# Patient Record
Sex: Female | Born: 1980 | Race: White | Hispanic: No | Marital: Single | Smoking: Never smoker
Health system: Southern US, Community
[De-identification: ages and names within clinical notes are randomized; demographics above are authoritative.]

## PROBLEM LIST (undated history)

## (undated) DIAGNOSIS — Z789 Other specified health status: Secondary | ICD-10-CM

## (undated) HISTORY — PX: NO PAST SURGERIES: SHX2092

## (undated) HISTORY — PX: OTHER SURGICAL HISTORY: SHX169

---

## 2005-06-30 ENCOUNTER — Emergency Department (HOSPITAL_COMMUNITY): Admission: EM | Admit: 2005-06-30 | Discharge: 2005-06-30 | Payer: Self-pay | Admitting: Emergency Medicine

## 2005-07-17 ENCOUNTER — Other Ambulatory Visit: Admission: RE | Admit: 2005-07-17 | Discharge: 2005-07-17 | Payer: Self-pay | Admitting: Obstetrics and Gynecology

## 2006-07-21 ENCOUNTER — Other Ambulatory Visit: Admission: RE | Admit: 2006-07-21 | Discharge: 2006-07-21 | Payer: Self-pay | Admitting: Obstetrics and Gynecology

## 2007-06-19 ENCOUNTER — Emergency Department (HOSPITAL_COMMUNITY): Admission: EM | Admit: 2007-06-19 | Discharge: 2007-06-19 | Payer: Self-pay | Admitting: Emergency Medicine

## 2007-07-22 ENCOUNTER — Other Ambulatory Visit: Admission: RE | Admit: 2007-07-22 | Discharge: 2007-07-22 | Payer: Self-pay | Admitting: Obstetrics and Gynecology

## 2008-07-25 ENCOUNTER — Other Ambulatory Visit: Admission: RE | Admit: 2008-07-25 | Discharge: 2008-07-25 | Payer: Self-pay | Admitting: Obstetrics and Gynecology

## 2009-02-20 ENCOUNTER — Emergency Department (HOSPITAL_COMMUNITY): Admission: EM | Admit: 2009-02-20 | Discharge: 2009-02-20 | Payer: Self-pay | Admitting: Family Medicine

## 2009-03-16 ENCOUNTER — Ambulatory Visit: Payer: Self-pay | Admitting: Family Medicine

## 2009-03-16 LAB — CONVERTED CEMR LAB
Nitrite: NEGATIVE
Protein, U semiquant: >=300
Specific Gravity, Urine: 1.015

## 2009-03-17 ENCOUNTER — Encounter: Payer: Self-pay | Admitting: Family Medicine

## 2009-08-02 LAB — CONVERTED CEMR LAB: Pap Smear: NORMAL

## 2009-08-09 ENCOUNTER — Other Ambulatory Visit: Admission: RE | Admit: 2009-08-09 | Discharge: 2009-08-09 | Payer: Self-pay | Admitting: Obstetrics and Gynecology

## 2009-10-06 ENCOUNTER — Ambulatory Visit: Payer: Self-pay | Admitting: Family Medicine

## 2009-10-06 DIAGNOSIS — R319 Hematuria, unspecified: Secondary | ICD-10-CM | POA: Insufficient documentation

## 2009-10-06 LAB — CONVERTED CEMR LAB
Hemoglobin: 13.5 g/dL
Ketones, urine, test strip: NEGATIVE
Nitrite: NEGATIVE
Protein, U semiquant: NEGATIVE

## 2009-10-10 ENCOUNTER — Telehealth (INDEPENDENT_AMBULATORY_CARE_PROVIDER_SITE_OTHER): Payer: Self-pay | Admitting: *Deleted

## 2009-10-12 ENCOUNTER — Ambulatory Visit: Payer: Self-pay | Admitting: Family Medicine

## 2009-10-13 LAB — CONVERTED CEMR LAB
ALT: 10 units/L (ref 0–35)
AST: 19 units/L (ref 0–37)
BUN: 13 mg/dL (ref 6–23)
Basophils Relative: 0.3 % (ref 0.0–3.0)
Bilirubin, Direct: 0.2 mg/dL (ref 0.0–0.3)
CO2: 27 meq/L (ref 19–32)
Chloride: 105 meq/L (ref 96–112)
Eosinophils Relative: 0.5 % (ref 0.0–5.0)
HCT: 42.7 % (ref 36.0–46.0)
Hemoglobin: 14.4 g/dL (ref 12.0–15.0)
Lymphs Abs: 3.7 10*3/uL (ref 0.7–4.0)
MCV: 89.5 fL (ref 78.0–100.0)
Monocytes Relative: 6.6 % (ref 3.0–12.0)
Neutro Abs: 6.9 10*3/uL (ref 1.4–7.7)
Potassium: 3.9 meq/L (ref 3.5–5.1)
TSH: 2.32 microintl units/mL (ref 0.35–5.50)
Total Bilirubin: 0.6 mg/dL (ref 0.3–1.2)
Total CHOL/HDL Ratio: 4
Total Protein: 7.4 g/dL (ref 6.0–8.3)
WBC: 11.4 10*3/uL — ABNORMAL HIGH (ref 4.5–10.5)

## 2009-11-09 ENCOUNTER — Ambulatory Visit: Payer: Self-pay | Admitting: Family Medicine

## 2009-11-09 LAB — CONVERTED CEMR LAB
Bilirubin Urine: NEGATIVE
Ketones, urine, test strip: NEGATIVE
Protein, U semiquant: NEGATIVE
Urobilinogen, UA: 0.2
pH: 6

## 2009-11-23 ENCOUNTER — Encounter: Payer: Self-pay | Admitting: Family Medicine

## 2009-11-24 ENCOUNTER — Ambulatory Visit (HOSPITAL_COMMUNITY): Admission: RE | Admit: 2009-11-24 | Discharge: 2009-11-24 | Payer: Self-pay | Admitting: Urology

## 2009-12-29 ENCOUNTER — Encounter (INDEPENDENT_AMBULATORY_CARE_PROVIDER_SITE_OTHER): Payer: Self-pay | Admitting: *Deleted

## 2009-12-29 ENCOUNTER — Encounter: Payer: Self-pay | Admitting: Family Medicine

## 2009-12-29 LAB — CONVERTED CEMR LAB
Albumin: 4.4 g/dL
BUN: 16 mg/dL
Calcium: 8.8 mg/dL
HCT: 39.3 %
MCH: 29.2 pg
MCV: 88.3 fL
Sodium, serum: 141 mmol/L
TSH: 3.2 microintl units/mL
Total Bilirubin: 0.3 mg/dL
Triglycerides: 91 mg/dL

## 2010-03-27 ENCOUNTER — Encounter (INDEPENDENT_AMBULATORY_CARE_PROVIDER_SITE_OTHER): Payer: Self-pay | Admitting: *Deleted

## 2010-04-03 NOTE — Assessment & Plan Note (Signed)
Summary: POSSIBLE UTI/KH   Vital Signs:  Patient Profile:   30 Years Old Female CC:      blood in urine, painful urination Height:     62 inches Weight:      116 pounds O2 Sat:      98 % Temp:     98.4 degrees F oral Pulse rate:   88 / minute Resp:     12 per minute BP sitting:   134 / 84  (right arm)  Pt. in pain?   yes    Intensity:   10    Type:       burning  Vitals Entered By: Lita Mains                   Current Allergies: No known allergies History of Present Illness History from: patient Chief Complaint: blood in urine, painful urination History of Present Illness: painful urination since Monday with gradual increasing severity to this AM. Intense painthis AM with blood in urine. Previous UTI in 01/2009 NO FEVER, NO BACK OR FLANK PAIN, NO N/V. NO VAG DISCHARGE. HAS FREQUENCY, DYSURIA AND URGENCY  REVIEW OF SYSTEMS Constitutional Symptoms      Denies fever, chills, night sweats, weight loss, weight gain, and fatigue.  Eyes       Denies change in vision, eye pain, eye discharge, glasses, contact lenses, and eye surgery. Ear/Nose/Throat/Mouth       Denies hearing loss/aids, change in hearing, ear pain, ear discharge, dizziness, frequent runny nose, frequent nose bleeds, sinus problems, sore throat, hoarseness, and tooth pain or bleeding.  Respiratory       Denies dry cough, productive cough, wheezing, shortness of breath, asthma, bronchitis, and emphysema/COPD.  Cardiovascular       Denies murmurs, chest pain, and tires easily with exhertion.    Gastrointestinal       Denies stomach pain, nausea/vomiting, diarrhea, constipation, blood in bowel movements, and indigestion. Genitourniary       Complains of painful urination.      Denies blood or discharge from vagina, kidney stones, and loss of urinary control.      Comments: blood in urine Neurological       Denies paralysis, seizures, and fainting/blackouts. Musculoskeletal       Denies muscle pain, joint  pain, joint stiffness, decreased range of motion, redness, swelling, muscle weakness, and gout.      Comments: lower pelvic pain Skin       Denies bruising, unusual mles/lumps or sores, and hair/skin or nail changes.  Psych       Denies mood changes, temper/anger issues, anxiety/stress, speech problems, depression, and sleep problems.  Past History:  Family History: Last updated: 03/16/2009 Family History High cholesterol Family History Hypertension-father Family History Thyroid disease-mother  Social History: Last updated: 03/16/2009 Single Never Smoked Alcohol use-no Drug use-no  Past Medical History: Unremarkable  Past Surgical History: Vaginal cysts drained- 06/2007 and 01/2009  Family History: Family History High cholesterol Family History Hypertension-father Family History Thyroid disease-mother  Social History: Single Never Smoked Alcohol use-no Drug use-no Smoking Status:  never Drug Use:  no Physical Exam General appearance: well developed, well nourished, no acute distress Ears: normal, no lesions or deformities Nasal: mucosa pink, nonedematous, no septal deviation, turbinates normal Oral/Pharynx: tongue normal, posterior pharynx without erythema or exudate Chest/Lungs: no rales, wheezes, or rhonchi bilateral, breath sounds equal without effort Heart: regular rate and  rhythm, no murmur Abdomen: soft, non-tender without obvious organomegaly Back: no tenderness  over musculature, straight leg raises negative bilaterally, deep tendon reflexes 2+ at achilles and patella Assessment New Problems: URINARY TRACT INFECTION (ICD-599.0)   Plan New Medications/Changes: PYRIDIUM 200 MG TABS (PHENAZOPYRIDINE HCL) 1 by mouth three times a day PRN  ##12 x 0, 03/16/2009, Cyana Shook DO MACROBID 100 MG CAPS (NITROFURANTOIN MONOHYD MACRO) 1 by mouth BID  ##14 x 0, 03/16/2009, Marvis Moeller DO  New Orders: New Patient Level III [99203]    Prescriptions: PYRIDIUM 200 MG TABS (PHENAZOPYRIDINE HCL) 1 by mouth three times a day PRN  ##12 x 0   Entered and Authorized by:   Marvis Moeller DO   Signed by:   Marvis Moeller DO on 03/16/2009   Method used:   Print then Give to Patient   RxID:   [id number] MACROBID 100 MG CAPS (NITROFURANTOIN MONOHYD MACRO) 1 by mouth BID  ##14 x 0   Entered and Authorized by:   Marvis Moeller DO   Signed by:   Marvis Moeller DO on 03/16/2009   Method used:   Print then Give to Patient   RxID:   [id number]   Patient Instructions: 1)  AVOID CAFFEINE PRODUCTS. CRANBERRY JUICE RECOMMENDED. ONCE CULTURE RESULTS RECEIVED , WE WILL CONTACT YOU IF NEW INSTRUCTIONS INDICATED. FOLLOW UP WITH YOUR PCP OR RETURN IF SYMPTOMS PERSIST OR WORSEN.  Laboratory Results   Urine Tests  Date/Time Received:  March 16, 2009 12:13 PM   Date/Time Reported:  March 16, 2009 12:13 PM   Routine Urinalysis   Color: red Appearance: Cloudy Glucose: negative   (Normal Range: Negative) Bilirubin: small   (Normal Range: Negative) Ketone: negative   (Normal Range: Negative) Spec. Gravity: 1.015   (Normal Range: 1.003-1.035) Blood: large   (Normal Range: Negative) pH: 7.0   (Normal Range: 5.0-8.0) Protein: >=300   (Normal Range: Negative) Urobilinogen: 0.2   (Normal Range: 0-1) Nitrite: negative   (Normal Range: Negative) Leukocyte Esterace: large   (Normal Range: Negative)

## 2010-04-03 NOTE — Assessment & Plan Note (Signed)
Summary: NEW TO EST/CPX/KN   Vital Signs:  Patient profile:   30 year old female Height:      62.25 inches Weight:      115 pounds BMI:     20.94 Pulse rate:   79 / minute BP sitting:   110 / 70  (left arm)  Vitals Entered By: Doristine Devoid CMA (October 06, 2009 8:43 AM) CC: CPX-not fasting   Vision Screening:Left eye with correction: 20 / 15 Right eye with correction: 20 / 15 Both eyes with correction: 20 / 15         History of Present Illness: 30 yo woman here today to establish care.  no previous PCP.  GYNRichardson Dopp, exam done 6/11.  not fasting today.  no concerns today.  Preventive Screening-Counseling & Management  Alcohol-Tobacco     Alcohol drinks/day: <1     Smoking Status: never  Caffeine-Diet-Exercise     Does Patient Exercise: yes     Type of exercise: cardio     Times/week: <3      Sexual History:  currently monogamous.        Drug Use:  never.    Current Medications (verified): 1)  Junel 1/20 1-20 Mg-Mcg Tabs (Norethindrone Acet-Ethinyl Est) .... Once Daily  Allergies (verified): No Known Drug Allergies  Past History:  Past Medical History: Last updated: 03/16/2009 Unremarkable  Past Surgical History: Last updated: 03/16/2009 Vaginal cysts drained- 06/2007 and 01/2009  Family History: Last updated: 10/06/2009 Family History High cholesterol Family History Hypertension-father Family History Thyroid disease-mother CAD-paternal and maternal grandfather Breast Cancer- no Colon Cancer- no  Family History: Family History High cholesterol Family History Hypertension-father Family History Thyroid disease-mother CAD-paternal and maternal grandfather Breast Cancer- no Colon Cancer- no  Social History: Single Working at [employer] on [employer], going back to school 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    Physical Exam  General:  well developed, well nourished, no acute distress Head:  Normocephalic and atraumatic without obvious abnormalities. No apparent alopecia or balding. Eyes:  No corneal or conjunctival inflammation noted. EOMI. Perrla. Funduscopic exam benign, without hemorrhages, exudates or papilledema. Vision grossly normal. Ears:  External ear exam shows no significant lesions or deformities.  Otoscopic examination reveals clear canals, tympanic membranes are intact bilaterally without bulging, retraction, inflammation or discharge. Hearing is grossly normal bilaterally. Nose:  External nasal examination shows no deformity or inflammation. Nasal mucosa are pink and moist without lesions or exudates. Mouth:  Oral mucosa and oropharynx without lesions or exudates.  Teeth in good repair. Neck:  No deformities, masses, or tenderness noted. Breasts:  deferred to GYN Lungs:  Normal respiratory effort, chest expands symmetrically. Lungs are clear to auscultation, no crackles or wheezes. Heart:  Normal rate and regular rhythm. S1 and S2 normal without gallop, murmur, click, rub or other extra sounds. Abdomen:  Bowel sounds positive,abdomen soft and non-tender without masses, organomegaly or hernias noted. Genitalia:  deferred to gyn Msk:  No deformity or scoliosis noted of thoracic or lumbar spine.   Pulses:  +2 carotid, radial, DP Extremities:  No clubbing,  cyanosis, edema, or deformity noted with normal full range of motion of all joints.   Neurologic:  No cranial nerve deficits noted. Station and gait are normal. Plantar reflexes  are down-going bilaterally. DTRs are symmetrical throughout. Sensory, motor and coordinative functions appear intact. Skin:  Intact without suspicious lesions or rashes Cervical Nodes:  No lymphadenopathy noted Axillary Nodes:  No palpable lymphadenopathy Psych:  Cognition and judgment appear intact. Alert and cooperative with normal attention span and concentration. No apparent delusions, illusions, hallucinations   Impression & Recommendations:  Problem # 1:  PHYSICAL EXAMINATION (ICD-V70.0) Assessment New PE WNL.  form completed for nursing program.  anticipatory guidance provided. Orders: Vision Screening (11914) Hgb (78295)  Problem # 2:  HEMATURIA UNSPECIFIED (ICD-599.70) Assessment: New  pt w/ asymptomatic hematuria found on UA required for school.  send urine for cx.  repeat UA when pt returns for labs. The following medications were removed from the medication list:    Macrobid 100 Mg Caps (Nitrofurantoin monohyd macro) .Marland Kitchen... 1 by mouth bid  Orders: UA Dipstick w/o Micro (manual) (62130) T-Culture, Urine ([id number])  Complete Medication List: 1)  Junel 1/20 1-20 Mg-mcg Tabs (Norethindrone acet-ethinyl est) .... Once daily  Patient Instructions: 1)  Schedule a fasting lab visit at your convenience 2)  BMP prior to visit, ICD-9: V70 3)  Hepatic Panel prior to visit ICD-9: V70 4)  Lipid panel prior to visit ICD-9 : V70 5)  TSH prior to visit ICD-9 : V70 6)  CBC w/ Diff prior to visit ICD-9 : V70 7)  Your exam looks great!  Keep up the good work! 8)  Call with any questions or concerns 9)  Welcome!  We're glad to have you! 10)  Good luck at school!   Preventive Care Screening  Pap Smear:    Date:  08/02/2009    Results:  normal   Laboratory Results   Urine Tests    Routine Urinalysis   Glucose: negative   (Normal Range: Negative) Bilirubin: negative   (Normal Range: Negative) Ketone: negative   (Normal Range: Negative) Spec. Gravity: 1.015   (Normal  Range: 1.003-1.035) Blood: moderate   (Normal Range: Negative) pH: 7.0   (Normal Range: 5.0-8.0) Protein: negative   (Normal Range: Negative) Urobilinogen: 0.2   (Normal Range: 0-1) Nitrite: negative   (Normal Range: Negative) Leukocyte Esterace: negative   (Normal Range: Negative)     CBC   HGB:  13.5 g/dL   (Normal Range: 95.2-84.1 in Males, 12.0-15.0 in Females)

## 2010-04-03 NOTE — Assessment & Plan Note (Signed)
Summary: 1 month f/u recheck urine / tf,cma   Vital Signs:  Patient profile:   30 year old female Weight:      113 pounds Pulse rate:   78 / minute BP sitting:   112 / 74  (left arm)  Vitals Entered By: Doristine Devoid CMA (November 09, 2009 9:24 AM) CC: recheck urine   History of Present Illness: 30 yo woman here today to f/u on microscopic hematuria.  no gross blood visible, no dysuria, frequency, urgency, hesitancy.  family hx of kidney stones.  pt w/ ? hx of stone- had acute pain, frank blood in urine, went to UC for pain and was dx'd w/ UTI.  pt reports pain and urine cleared prior to starting abx.  Allergies (verified): No Known Drug Allergies  Review of Systems      See HPI  Physical Exam  General:  well developed, well nourished, no acute distress Abdomen:  soft, NT/ND, +BS Psych:  Cognition and judgment appear intact. Alert and cooperative with normal attention span and concentration.   Impression & Recommendations:  Problem # 1:  HEMATURIA UNSPECIFIED (ICD-599.70) Assessment Unchanged pt w/ persistant hematuria after 1 month.  asymptomatic.  will refer to urology. Orders: UA Dipstick w/o Micro (manual) ([id number]) Urology Referral (Urology)  Complete Medication List: 1)  Junel 1/20 1-20 Mg-mcg Tabs (Norethindrone acet-ethinyl est) .... Once daily  Patient Instructions: 1)  Someone will call you with your urology appt 2)  Plan on 1 year for your physical, sooner if you need me 3)  Call with any questions or concerns 4)  Try not to stress!!!  Laboratory Results   Urine Tests    Routine Urinalysis   Glucose: negative   (Normal Range: Negative) Bilirubin: negative   (Normal Range: Negative) Ketone: negative   (Normal Range: Negative) Spec. Gravity: 1.015   (Normal Range: 1.003-1.035) Blood: moderate   (Normal Range: Negative) pH: 6.0   (Normal Range: 5.0-8.0) Protein: negative   (Normal Range: Negative) Urobilinogen: 0.2   (Normal Range: 0-1) Nitrite:  negative   (Normal Range: Negative) Leukocyte Esterace: negative   (Normal Range: Negative)

## 2010-04-03 NOTE — Consult Note (Signed)
Summary: [facility]  [facility]   Imported By: Lanelle Bal 12/06/2009 08:39:18  _____________________________________________________________________  External Attachment:    Type:   Image     Comment:   External Document

## 2010-04-03 NOTE — Progress Notes (Signed)
Summary: urine cx  Phone Note Outgoing Call   Call placed by: Doristine Devoid CMA,  October 10, 2009 4:02 PM Call placed to: Patient Summary of Call: no infxn present.  pt w/ unexplained microscopic hematuria.  should repeat UA in 1 month- if still present, will refer to Urology  Follow-up for Phone Call        left message on machine ..........Marland KitchenDoristine Devoid CMA  October 10, 2009 4:02 PM   spoke w/ patient aware of recommendations........Marland KitchenDoristine Devoid CMA  October 10, 2009 4:41 PM

## 2010-04-05 NOTE — Miscellaneous (Signed)
Summary: labs from health screening  Clinical Lists Changes  Observations: Added new observation of TSH: 3.2 microintl units/mL (12/29/2009 9:05) Added new observation of TRIGLYC TOT: 91 mg/dL (54/11/8117 1:47) Added new observation of LDL: 125 mg/dL (82/95/6213 0:86) Added new observation of HDL: 62 mg/dL (57/84/6962 9:52) Added new observation of CHOLESTEROL: 205 mg/dL (84/13/2440 1:02) Added new observation of BILI TOTAL: 0.30 mg/dL (72/53/6644 0:34) Added new observation of ALK PHOS: 41 units/L (12/29/2009 9:05) Added new observation of SGOT (AST): 14 units/L (12/29/2009 9:05) Added new observation of ALBUMIN: 4.4 g/dL (74/25/9563 8:75) Added new observation of CALCIUM: 8.8 mg/dL (64/33/2951 8:84) Added new observation of GLUCOSE SER: 95 mg/dL (16/60/6301 6:01) Added new observation of CREATININE: 0.62 mg/dL (09/32/3557 3:22) Added new observation of BUN: 16 mg/dL (02/54/2706 2:37) Added new observation of CO2 TOTAL: 23 mmol/L (12/29/2009 9:05) Added new observation of CHLORIDE: 103 mmol/L (12/29/2009 9:05) Added new observation of POTASSIUM: 3.6 mmol/L (12/29/2009 9:05) Added new observation of SODIUM: 141 mmol/L (12/29/2009 9:05) Added new observation of PLATELETS: 402 10*3/mm3 (12/29/2009 9:05) Added new observation of MCH: 29.2 pg (12/29/2009 9:05) Added new observation of MCV: 88.3 fL (12/29/2009 9:05) Added new observation of HCT: 39.3 % (12/29/2009 9:05) Added new observation of HGB: 13 g/dL (62/83/1517 6:16) Added new observation of WBC: 12.1 10*3/mm3 (12/29/2009 9:05)  Appended Document: labs from health screening cholesterol much better than at last check- good work!  Appended Document: labs from health screening mailed

## 2010-04-11 NOTE — Letter (Signed)
Summary: [facility]  [facility]   Imported By: Lanelle Bal 04/03/2010 13:49:27  _____________________________________________________________________  External Attachment:    Type:   Image     Comment:   External Document

## 2010-08-20 ENCOUNTER — Other Ambulatory Visit (HOSPITAL_COMMUNITY)
Admission: RE | Admit: 2010-08-20 | Discharge: 2010-08-20 | Disposition: A | Discharge status: Home / Self Care | Payer: Commercial Managed Care - PPO | Source: Ambulatory Visit | Attending: Obstetrics and Gynecology | Admitting: Obstetrics and Gynecology

## 2010-08-20 ENCOUNTER — Other Ambulatory Visit: Payer: Self-pay | Admitting: Obstetrics and Gynecology

## 2010-08-20 DIAGNOSIS — Z01419 Encounter for gynecological examination (general) (routine) without abnormal findings: Secondary | ICD-10-CM | POA: Insufficient documentation

## 2010-10-08 ENCOUNTER — Encounter: Payer: Self-pay | Admitting: Family Medicine

## 2010-10-17 ENCOUNTER — Encounter: Payer: Self-pay | Admitting: Family Medicine

## 2010-10-17 ENCOUNTER — Ambulatory Visit (INDEPENDENT_AMBULATORY_CARE_PROVIDER_SITE_OTHER): Payer: Commercial Managed Care - PPO | Admitting: Family Medicine

## 2010-10-17 DIAGNOSIS — Z0000 Encounter for general adult medical examination without abnormal findings: Secondary | ICD-10-CM | POA: Insufficient documentation

## 2010-10-17 LAB — POCT URINALYSIS DIPSTICK
Bilirubin, UA: NEGATIVE
Glucose, UA: NEGATIVE
Ketones, UA: NEGATIVE
Leukocytes, UA: NEGATIVE
Nitrite, UA: NEGATIVE
Protein, UA: NEGATIVE
Spec Grav, UA: 1.015
Urobilinogen, UA: NEGATIVE
pH, UA: 5

## 2010-10-17 LAB — POCT HEMOGLOBIN: Hemoglobin: 13.4

## 2010-10-17 NOTE — Progress Notes (Signed)
  Subjective:    Patient ID: Diane Barrett, female    DOB: 02/08/1981, 30 y.o.   MRN: [id number]  HPI CPE- UTD on pap.  No concerns today.   Review of Systems Patient reports no vision/ hearing changes, adenopathy,fever, weight change,  persistant/recurrent hoarseness , swallowing issues, chest pain, palpitations, edema, persistant/recurrent cough, hemoptysis, dyspnea (rest/exertional/paroxysmal nocturnal), gastrointestinal bleeding (melena, rectal bleeding), abdominal pain, significant heartburn, bowel changes, GU symptoms (dysuria, hematuria, incontinence), Gyn symptoms (abnormal  bleeding, pain),  syncope, focal weakness, memory loss, numbness & tingling, skin/hair/nail changes, abnormal bruising or bleeding, anxiety, or depression.     Objective:   Physical Exam  General Appearance:    Alert, cooperative, no distress, appears stated age  Head:    Normocephalic, without obvious abnormality, atraumatic  Eyes:    PERRL, conjunctiva/corneas clear, EOM's intact, fundi    benign, both eyes  Ears:    Normal TM's and external ear canals, both ears  Nose:   Nares normal, septum midline, mucosa normal, no drainage    or sinus tenderness  Throat:   Lips, mucosa, and tongue normal; teeth and gums normal  Neck:   Supple, symmetrical, trachea midline, no adenopathy;    Thyroid: no enlargement/tenderness/nodules  Back:     Symmetric, no curvature, ROM normal, no CVA tenderness  Lungs:     Clear to auscultation bilaterally, respirations unlabored  Chest Wall:    No tenderness or deformity   Heart:    Regular rate and rhythm, S1 and S2 normal, no murmur, rub   or gallop  Breast Exam:    Deferred to GYN  Abdomen:     Soft, non-tender, bowel sounds active all four quadrants,    no masses, no organomegaly  Genitalia:    Deferred to GYN  Rectal:    Extremities:   Extremities normal, atraumatic, no cyanosis or edema  Pulses:   2+ and symmetric all extremities  Skin:   Skin color, texture, turgor  normal, no rashes or lesions  Lymph nodes:   Cervical, supraclavicular, and axillary nodes normal  Neurologic:   CNII-XII intact, normal strength, sensation and reflexes    throughout          Assessment & Plan:

## 2010-10-17 NOTE — Patient Instructions (Signed)
Follow up in 1 year or as needed Fax me a copy of your lab results Call with any questions or concerns Good luck w/ school!

## 2010-10-17 NOTE — Assessment & Plan Note (Signed)
Pt's PE WNL.  Will have labs done at routine employment screening this fall.  Anticipatory guidance provided.  Form completed for school.

## 2011-08-20 ENCOUNTER — Other Ambulatory Visit: Payer: Self-pay | Admitting: Obstetrics and Gynecology

## 2011-08-20 ENCOUNTER — Other Ambulatory Visit (HOSPITAL_COMMUNITY)
Admission: RE | Admit: 2011-08-20 | Discharge: 2011-08-20 | Disposition: A | Discharge status: Home / Self Care | Payer: 59 | Source: Ambulatory Visit | Attending: Obstetrics and Gynecology | Admitting: Obstetrics and Gynecology

## 2011-08-20 DIAGNOSIS — Z01419 Encounter for gynecological examination (general) (routine) without abnormal findings: Secondary | ICD-10-CM | POA: Insufficient documentation

## 2011-10-22 ENCOUNTER — Encounter: Payer: Self-pay | Admitting: Family Medicine

## 2011-10-22 ENCOUNTER — Ambulatory Visit (INDEPENDENT_AMBULATORY_CARE_PROVIDER_SITE_OTHER): Payer: 59 | Admitting: Family Medicine

## 2011-10-22 VITALS — BP 108/70 | HR 100 | Temp 98.4°F | Ht 62.5 in | Wt 120.2 lb

## 2011-10-22 DIAGNOSIS — Z0000 Encounter for general adult medical examination without abnormal findings: Secondary | ICD-10-CM

## 2011-10-22 DIAGNOSIS — Z13220 Encounter for screening for lipoid disorders: Secondary | ICD-10-CM

## 2011-10-22 LAB — LIPID PANEL
Total CHOL/HDL Ratio: 4
Triglycerides: 101 mg/dL (ref 0.0–149.0)
VLDL: 20.2 mg/dL (ref 0.0–40.0)

## 2011-10-22 LAB — HEPATIC FUNCTION PANEL
ALT: 15 U/L (ref 0–35)
Alkaline Phosphatase: 45 U/L (ref 39–117)
Bilirubin, Direct: 0.1 mg/dL (ref 0.0–0.3)
Total Protein: 8.3 g/dL (ref 6.0–8.3)

## 2011-10-22 LAB — CBC WITH DIFFERENTIAL/PLATELET
Basophils Absolute: 0 10*3/uL (ref 0.0–0.1)
Lymphocytes Relative: 31 % (ref 12.0–46.0)
Monocytes Relative: 6 % (ref 3.0–12.0)
Neutrophils Relative %: 62.1 % (ref 43.0–77.0)
Platelets: 392 10*3/uL (ref 150.0–400.0)
RDW: 12.6 % (ref 11.5–14.6)

## 2011-10-22 LAB — BASIC METABOLIC PANEL
CO2: 26 mEq/L (ref 19–32)
Chloride: 102 mEq/L (ref 96–112)
Sodium: 136 mEq/L (ref 135–145)

## 2011-10-22 LAB — LDL CHOLESTEROL, DIRECT: Direct LDL: 176 mg/dL

## 2011-10-22 NOTE — Assessment & Plan Note (Signed)
Pt's PE WNL.  UTD on GYN.  Check labs.  Anticipatory guidance provided.  

## 2011-10-22 NOTE — Telephone Encounter (Signed)
Please review pt response.

## 2011-10-22 NOTE — Progress Notes (Signed)
  Subjective:    Patient ID: Diane Barrett, female    DOB: [birth year], 31 y.o.   MRN: [id number]  HPI CPE- has GYN.  No concerns.   Review of Systems Patient reports no vision/ hearing changes, adenopathy, fever, weight change,  persistant/recurrent hoarseness , swallowing issues, chest pain, palpitations, edema, persistant/recurrent cough, hemoptysis, dyspnea (rest/exertional/paroxysmal nocturnal), gastrointestinal bleeding (melena, rectal bleeding), abdominal pain, significant heartburn, bowel changes, GU symptoms (dysuria, hematuria, incontinence), Gyn symptoms (abnormal  bleeding, pain),  syncope, focal weakness, memory loss, numbness & tingling, skin/hair/nail changes, abnormal bruising or bleeding, anxiety, or depression.     Objective:   Physical Exam General Appearance:    Alert, cooperative, no distress, appears stated age  Head:    Normocephalic, without obvious abnormality, atraumatic  Eyes:    PERRL, conjunctiva/corneas clear, EOM's intact, fundi    benign, both eyes  Ears:    Normal TM's and external ear canals, both ears  Nose:   Nares normal, septum midline, mucosa normal, no drainage    or sinus tenderness  Throat:   Lips, mucosa, and tongue normal; teeth and gums normal  Neck:   Supple, symmetrical, trachea midline, no adenopathy;    Thyroid: no enlargement/tenderness/nodules  Back:     Symmetric, no curvature, ROM normal, no CVA tenderness  Lungs:     Clear to auscultation bilaterally, respirations unlabored  Chest Wall:    No tenderness or deformity   Heart:    Regular rate and rhythm, S1 and S2 normal, no murmur, rub   or gallop  Breast Exam:    Deferred to GYN  Abdomen:     Soft, non-tender, bowel sounds active all four quadrants,    no masses, no organomegaly  Genitalia:    Deferred to GYN  Rectal:    Extremities:   Extremities normal, atraumatic, no cyanosis or edema  Pulses:   2+ and symmetric all extremities  Skin:   Skin color, texture, turgor normal, no  rashes or lesions  Lymph nodes:   Cervical, supraclavicular, and axillary nodes normal  Neurologic:   CNII-XII intact, normal strength, sensation and reflexes    throughout          Assessment & Plan:

## 2011-10-22 NOTE — Patient Instructions (Addendum)
Follow up in 1 year or as needed Keep up the good work!  You look great! We'll notify you of your lab results Call with any questions or concerns Congrats on the wedding and good luck w/ school!!!

## 2011-10-23 NOTE — Telephone Encounter (Signed)
Please note

## 2011-10-25 LAB — VITAMIN D 1,25 DIHYDROXY: Vitamin D 1, 25 (OH)2 Total: 89 pg/mL — ABNORMAL HIGH (ref 18–72)

## 2012-04-18 ENCOUNTER — Other Ambulatory Visit: Payer: Self-pay

## 2012-08-20 ENCOUNTER — Other Ambulatory Visit: Payer: Self-pay | Admitting: Obstetrics and Gynecology

## 2012-08-20 ENCOUNTER — Other Ambulatory Visit (HOSPITAL_COMMUNITY)
Admission: RE | Admit: 2012-08-20 | Discharge: 2012-08-20 | Disposition: A | Discharge status: Home / Self Care | Payer: 59 | Source: Ambulatory Visit | Attending: Obstetrics and Gynecology | Admitting: Obstetrics and Gynecology

## 2012-08-20 DIAGNOSIS — Z1151 Encounter for screening for human papillomavirus (HPV): Secondary | ICD-10-CM | POA: Insufficient documentation

## 2012-08-20 DIAGNOSIS — Z01419 Encounter for gynecological examination (general) (routine) without abnormal findings: Secondary | ICD-10-CM | POA: Insufficient documentation

## 2012-09-07 ENCOUNTER — Ambulatory Visit (INDEPENDENT_AMBULATORY_CARE_PROVIDER_SITE_OTHER): Payer: 59 | Admitting: Internal Medicine

## 2012-09-07 ENCOUNTER — Encounter: Payer: Self-pay | Admitting: Internal Medicine

## 2012-09-07 VITALS — BP 118/70 | HR 83 | Temp 98.9°F | Wt 121.0 lb

## 2012-09-07 DIAGNOSIS — H15002 Unspecified scleritis, left eye: Secondary | ICD-10-CM

## 2012-09-07 DIAGNOSIS — H15009 Unspecified scleritis, unspecified eye: Secondary | ICD-10-CM

## 2012-09-07 MED ORDER — ERYTHROMYCIN 5 MG/GM OP OINT: TOPICAL_OINTMENT | OPHTHALMIC | Status: DC

## 2012-09-07 NOTE — Progress Notes (Signed)
  Subjective:    Patient ID: Diane Barrett, female    DOB: November 13, 1980, 32 y.o.   MRN: [id number]  HPI   She awoke this morning with some tenderness in the left eye with associated erythema. She does sleep with her contacts immediately removed the left lens.  She denies associated blurred vision, double vision, or loss of vision. She's had no discharge from the left.  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  No  lymphadenopathy about the head, neck, or axilla noted.   Eyes: No conjunctival inflammation or lid edema is present. There is accentuation of the scleral vasculature in the left eye laterally and superiorly as well as inferiorly below the iris. No purulent discharge is present. She has no discomfort with extraocular motion. Vision is excellent right; its decreased on the left as she has removed the contact  Ears:  External ear exam shows no significant lesions or deformities.  Otoscopic examination reveals clear canals, tympanic membranes are intact bilaterally without bulging, retraction, inflammation or discharge.  Nose:  External nasal examination shows no deformity or inflammation. Nasal mucosa are pink and moist without lesions or exudates. No septal dislocation or deviation.No obstruction to airflow.   Oral exam: Dental hygiene is good; lips and gums are healthy appearing.There is no oropharyngeal erythema or exudate noted.   Neck:  No deformities,  masses, or tenderness noted.   Supple with full range of motion without pain.   Skin: Warm & dry w/o jaundice or tenting.         Assessment & Plan:  #1 discomfort left thigh with some prominence of the scleral vessels. No active  conjunctivitis present clinically the most likely etiology is irritation related to the contact she removed as the symptoms are improving.  Plan: Natural tears every 1 hour while awake. Warning signs discussed. She was given a prescription for erythromycin  Ophth ointment if symptoms progress.

## 2012-09-07 NOTE — Patient Instructions (Addendum)
Fill Rx for ointment if Warning Signs present.

## 2012-10-23 ENCOUNTER — Encounter: Payer: 59 | Admitting: Family Medicine

## 2013-01-07 ENCOUNTER — Other Ambulatory Visit: Payer: Self-pay

## 2013-09-09 ENCOUNTER — Other Ambulatory Visit (HOSPITAL_COMMUNITY)
Admission: RE | Admit: 2013-09-09 | Discharge: 2013-09-09 | Disposition: A | Discharge status: Home / Self Care | Payer: PRIVATE HEALTH INSURANCE | Source: Ambulatory Visit | Attending: Obstetrics and Gynecology | Admitting: Obstetrics and Gynecology

## 2013-09-09 ENCOUNTER — Other Ambulatory Visit: Payer: Self-pay | Admitting: Obstetrics and Gynecology

## 2013-09-09 DIAGNOSIS — Z124 Encounter for screening for malignant neoplasm of cervix: Secondary | ICD-10-CM | POA: Insufficient documentation

## 2013-09-13 LAB — CYTOLOGY - PAP

## 2013-09-23 ENCOUNTER — Encounter: Payer: Self-pay | Admitting: Family Medicine

## 2013-09-23 ENCOUNTER — Ambulatory Visit (INDEPENDENT_AMBULATORY_CARE_PROVIDER_SITE_OTHER): Payer: PRIVATE HEALTH INSURANCE | Admitting: Family Medicine

## 2013-09-23 VITALS — BP 110/72 | HR 84 | Temp 98.2°F | Resp 16 | Ht 62.5 in | Wt 131.4 lb

## 2013-09-23 DIAGNOSIS — Z0000 Encounter for general adult medical examination without abnormal findings: Secondary | ICD-10-CM

## 2013-09-23 LAB — HEPATIC FUNCTION PANEL
ALK PHOS: 55 U/L (ref 39–117)
ALT: 20 U/L (ref 0–35)
AST: 26 U/L (ref 0–37)
Albumin: 4.4 g/dL (ref 3.5–5.2)
BILIRUBIN DIRECT: 0.1 mg/dL (ref 0.0–0.3)
TOTAL PROTEIN: 8 g/dL (ref 6.0–8.3)
Total Bilirubin: 0.8 mg/dL (ref 0.2–1.2)

## 2013-09-23 LAB — LIPID PANEL
CHOLESTEROL: 255 mg/dL — AB (ref 0–200)
HDL: 69.6 mg/dL (ref >39.00)
LDL Cholesterol: 163 mg/dL — ABNORMAL HIGH (ref 0–99)
NonHDL: 185.4
TRIGLYCERIDES: 113 mg/dL (ref 0.0–149.0)
Total CHOL/HDL Ratio: 4
VLDL: 22.6 mg/dL (ref 0.0–40.0)

## 2013-09-23 LAB — BASIC METABOLIC PANEL
BUN: 14 mg/dL (ref 6–23)
CO2: 30 meq/L (ref 19–32)
Calcium: 9 mg/dL (ref 8.4–10.5)
Chloride: 103 mEq/L (ref 96–112)
Creatinine, Ser: 0.5 mg/dL (ref 0.4–1.2)
GFR: 148.86 mL/min (ref >60.00)
GLUCOSE: 86 mg/dL (ref 70–99)
POTASSIUM: 3.8 meq/L (ref 3.5–5.1)
SODIUM: 138 meq/L (ref 135–145)

## 2013-09-23 LAB — CBC WITH DIFFERENTIAL/PLATELET
BASOS ABS: 0 10*3/uL (ref 0.0–0.1)
Basophils Relative: 0.4 % (ref 0.0–3.0)
EOS ABS: 0 10*3/uL (ref 0.0–0.7)
Eosinophils Relative: 0.5 % (ref 0.0–5.0)
HCT: 42.6 % (ref 36.0–46.0)
Hemoglobin: 14.4 g/dL (ref 12.0–15.0)
LYMPHS PCT: 29.6 % (ref 12.0–46.0)
Lymphs Abs: 2.1 10*3/uL (ref 0.7–4.0)
MCHC: 33.8 g/dL (ref 30.0–36.0)
MCV: 87.4 fl (ref 78.0–100.0)
Monocytes Absolute: 0.4 10*3/uL (ref 0.1–1.0)
Monocytes Relative: 5.7 % (ref 3.0–12.0)
NEUTROS PCT: 63.8 % (ref 43.0–77.0)
Neutro Abs: 4.5 10*3/uL (ref 1.4–7.7)
PLATELETS: 381 10*3/uL (ref 150.0–400.0)
RBC: 4.88 Mil/uL (ref 3.87–5.11)
RDW: 13.2 % (ref 11.5–15.5)
WBC: 7.1 10*3/uL (ref 4.0–10.5)

## 2013-09-23 LAB — TSH: TSH: 1.01 u[IU]/mL (ref 0.35–4.50)

## 2013-09-23 LAB — VITAMIN D 25 HYDROXY (VIT D DEFICIENCY, FRACTURES): VITD: 37.47 ng/mL (ref 30.00–100.00)

## 2013-09-23 NOTE — Patient Instructions (Signed)
Follow up in 1 year or as needed Keep up the good work!  You look great! We'll notify you of your lab results and make any changes if needed Good luck w/ all things baby!!! Enjoy the rest of your summer!!!

## 2013-09-23 NOTE — Progress Notes (Signed)
   Subjective:    Patient ID: Diane Barrett, female    DOB: [birth year], 33 y.o.   MRN: [id number]  HPI CPE- UTD on GYN Richardson Dopp([place]).  Trying to get pregnant.   Review of Systems Patient reports no vision/ hearing changes, adenopathy,fever, weight change,  persistant/recurrent hoarseness , swallowing issues, chest pain, palpitations, edema, persistant/recurrent cough, hemoptysis, dyspnea (rest/exertional/paroxysmal nocturnal), gastrointestinal bleeding (melena, rectal bleeding), abdominal pain, significant heartburn, bowel changes, GU symptoms (dysuria, hematuria, incontinence), Gyn symptoms (abnormal  bleeding, pain),  syncope, focal weakness, memory loss, numbness & tingling, skin/hair/nail changes, abnormal bruising or bleeding, anxiety, or depression.     Objective:   Physical Exam General Appearance:    Alert, cooperative, no distress, appears stated age  Head:    Normocephalic, without obvious abnormality, atraumatic  Eyes:    PERRL, conjunctiva/corneas clear, EOM's intact, fundi    benign, both eyes  Ears:    Normal TM's and external ear canals, both ears  Nose:   Nares normal, septum midline, mucosa normal, no drainage    or sinus tenderness  Throat:   Lips, mucosa, and tongue normal; teeth and gums normal  Neck:   Supple, symmetrical, trachea midline, no adenopathy;    Thyroid: no enlargement/tenderness/nodules  Back:     Symmetric, no curvature, ROM normal, no CVA tenderness  Lungs:     Clear to auscultation bilaterally, respirations unlabored  Chest Wall:    No tenderness or deformity   Heart:    Regular rate and rhythm, S1 and S2 normal, no murmur, rub   or gallop  Breast Exam:    Deferred to GYN  Abdomen:     Soft, non-tender, bowel sounds active all four quadrants,    no masses, no organomegaly  Genitalia:    Deferred to GYN  Rectal:    Extremities:   Extremities normal, atraumatic, no cyanosis or edema  Pulses:   2+ and symmetric all extremities  Skin:   Skin color,  texture, turgor normal, no rashes or lesions  Lymph nodes:   Cervical, supraclavicular, and axillary nodes normal  Neurologic:   CNII-XII intact, normal strength, sensation and reflexes    throughout          Assessment & Plan:

## 2013-09-23 NOTE — Progress Notes (Signed)
Pre visit review using our clinic review tool, if applicable. No additional management support is needed unless otherwise documented below in the visit note. 

## 2013-09-23 NOTE — Assessment & Plan Note (Signed)
Pt's PE WNL.  UTD on GYN.  Check labs.  Anticipatory guidance provided.  

## 2013-10-30 ENCOUNTER — Encounter (HOSPITAL_COMMUNITY): Payer: Self-pay

## 2013-10-30 ENCOUNTER — Inpatient Hospital Stay (HOSPITAL_COMMUNITY): Payer: PRIVATE HEALTH INSURANCE

## 2013-10-30 ENCOUNTER — Inpatient Hospital Stay (HOSPITAL_COMMUNITY)
Admission: AD | Admit: 2013-10-30 | Discharge: 2013-10-30 | Disposition: A | Discharge status: Home / Self Care | Payer: PRIVATE HEALTH INSURANCE | Source: Ambulatory Visit | Attending: Obstetrics and Gynecology | Admitting: Obstetrics and Gynecology

## 2013-10-30 DIAGNOSIS — O209 Hemorrhage in early pregnancy, unspecified: Secondary | ICD-10-CM | POA: Diagnosis present

## 2013-10-30 DIAGNOSIS — O200 Threatened abortion: Secondary | ICD-10-CM | POA: Insufficient documentation

## 2013-10-30 DIAGNOSIS — B3731 Acute candidiasis of vulva and vagina: Secondary | ICD-10-CM | POA: Diagnosis not present

## 2013-10-30 DIAGNOSIS — O2390 Unspecified genitourinary tract infection in pregnancy, unspecified trimester: Secondary | ICD-10-CM | POA: Insufficient documentation

## 2013-10-30 DIAGNOSIS — B373 Candidiasis of vulva and vagina: Secondary | ICD-10-CM

## 2013-10-30 HISTORY — DX: Other specified health status: Z78.9

## 2013-10-30 LAB — WET PREP, GENITAL
CLUE CELLS WET PREP: NONE SEEN
Trich, Wet Prep: NONE SEEN

## 2013-10-30 LAB — URINALYSIS, ROUTINE W REFLEX MICROSCOPIC
Bilirubin Urine: NEGATIVE
Glucose, UA: NEGATIVE mg/dL
Ketones, ur: NEGATIVE mg/dL
Nitrite: NEGATIVE
PROTEIN: 30 mg/dL — AB
Specific Gravity, Urine: 1.02 (ref 1.005–1.030)
UROBILINOGEN UA: 0.2 mg/dL (ref 0.0–1.0)
pH: 6 (ref 5.0–8.0)

## 2013-10-30 LAB — URINE MICROSCOPIC-ADD ON

## 2013-10-30 LAB — HCG, QUANTITATIVE, PREGNANCY: hCG, Beta Chain, Quant, S: 1835 m[IU]/mL — ABNORMAL HIGH (ref <5)

## 2013-10-30 LAB — CBC
HEMATOCRIT: 40.6 % (ref 36.0–46.0)
Hemoglobin: 13.7 g/dL (ref 12.0–15.0)
MCH: 29.6 pg (ref 26.0–34.0)
MCHC: 33.7 g/dL (ref 30.0–36.0)
MCV: 87.7 fL (ref 78.0–100.0)
PLATELETS: 391 10*3/uL (ref 150–400)
RBC: 4.63 MIL/uL (ref 3.87–5.11)
RDW: 12.8 % (ref 11.5–15.5)
WBC: 8.3 10*3/uL (ref 4.0–10.5)

## 2013-10-30 LAB — POCT PREGNANCY, URINE: PREG TEST UR: POSITIVE — AB

## 2013-10-30 LAB — ABO/RH: ABO/RH(D): A POS

## 2013-10-30 MED ORDER — MICONAZOLE NITRATE 2 % VA CREA: 1.0000 | TOPICAL_CREAM | Freq: Every day | VAGINAL | Status: AC

## 2013-10-30 NOTE — MAU Note (Signed)
Pt states here for vaginal bleeding noted this am when wiping. Intermittent lower abdominal cramping.

## 2013-10-30 NOTE — Discharge Instructions (Signed)
Pelvic Rest Pelvic rest is sometimes recommended for women when:   The placenta is partially or completely covering the opening of the cervix (placenta previa).  There is bleeding between the uterine wall and the amniotic sac in the first trimester (subchorionic hemorrhage).  The cervix begins to open without labor starting (incompetent cervix, cervical insufficiency).  The labor is too early (preterm labor). HOME CARE INSTRUCTIONS  Do not have sexual intercourse, stimulation, or an orgasm.  Do not use tampons, douche, or put anything in the vagina.  Do not lift anything over 10 pounds (4.5 kg).  Avoid strenuous activity or straining your pelvic muscles. SEEK MEDICAL CARE IF:  You have any vaginal bleeding during pregnancy. Treat this as a potential emergency.  You have cramping pain felt low in the stomach (stronger than menstrual cramps).  You notice vaginal discharge (watery, mucus, or bloody).  You have a low, dull backache.  There are regular contractions or uterine tightening. SEEK IMMEDIATE MEDICAL CARE IF: You have vaginal bleeding and have placenta previa.  Document Released: 06/15/2010 Document Revised: 05/13/2011 Document Reviewed: 06/15/2010 [facility] Patient Information 2015 [place], [place]. This information is not intended to replace advice given to you by your health care provider. Make sure you discuss any questions you have with your health care provider.  Candidal Vulvovaginitis Candidal vulvovaginitis is an infection of the vagina and vulva. The vulva is the skin around the opening of the vagina. This may cause itching and discomfort in and around the vagina.  HOME CARE  Only take medicine as told by your doctor.  Do not have sex (intercourse) until the infection is healed or as told by your doctor.  Practice safe sex.  Tell your sex partner about your infection.  Do not douche or use tampons.  Wear cotton underwear. Do not wear tight pants or panty  hose.  Eat yogurt. This may help treat and prevent yeast infections. GET HELP RIGHT AWAY IF:   You have a fever.  Your problems get worse during treatment or do not get better in 3 days.  You have discomfort, irritation, or itching in your vagina or vulva area.  You have pain after sex.  You start to get belly (abdominal) pain. MAKE SURE YOU:  Understand these instructions.  Will watch your condition.  Will get help right away if you are not doing well or get worse. Document Released: 05/17/2008 Document Revised: 02/23/2013 Document Reviewed: 05/17/2008 [facility] Patient Information 2015 [place], [place]. This information is not intended to replace advice given to you by your health care provider. Make sure you discuss any questions you have with your health care provider.

## 2013-10-30 NOTE — MAU Provider Note (Signed)
History     CSN: [id number]  Arrival date and time: 10/30/13 4098   First Provider Initiated Contact with Patient 10/30/13 [phone]      Chief Complaint  Patient presents with  . Vaginal Bleeding   HPI  Ms. Diane Barrett is a 33 y.o. female G1P0 at [redacted]w[redacted]d who presents with vaginal bleeding. She first noticed it last evening. It started out as brown spotting last evening, around 0500 the patient started having bright red vaginal bleeding. She is also complaining of mild menstrual like cramping in her lower abdomen. She is scheduled for her first prenatal appointment with Dr. Richardson Dopp this Wednesday.  No intercourse recently.   OB History   Grav Para Term Preterm Abortions TAB SAB Ect Mult Living   1               Past Medical History  Diagnosis Date  . Medical history non-contributory     Past Surgical History  Procedure Laterality Date  . Vaginal cyst drained  04/2007, 01/2009  . No past surgeries      Family History  Problem Relation Age of Onset  . Thyroid disease Mother   . Hypertension Father   . Coronary artery disease Maternal Grandfather   . Coronary artery disease Paternal Grandfather     History  Substance Use Topics  . Smoking status: Never Smoker   . Smokeless tobacco: Not on file  . Alcohol Use: No    Allergies: No Known Allergies  Prescriptions prior to admission  Medication Sig Dispense Refill  . Prenatal Vit-Fe Fumarate-FA (PRENATAL MULTIVITAMIN) TABS tablet Take 1 tablet by mouth daily at 12 noon.       Results for orders placed during the hospital encounter of 10/30/13 (from the past 48 hour(s))  URINALYSIS, ROUTINE W REFLEX MICROSCOPIC     Status: Abnormal   Collection Time    10/30/13  7:43 AM      Result Value Ref Range   Color, Urine RED (*) YELLOW   Comment: BIOCHEMICALS MAY BE AFFECTED BY COLOR   APPearance HAZY (*) CLEAR   Specific Gravity, Urine 1.020  1.005 - 1.030   pH 6.0  5.0 - 8.0   Glucose, UA NEGATIVE  NEGATIVE mg/dL   Hgb urine  dipstick LARGE (*) NEGATIVE   Bilirubin Urine NEGATIVE  NEGATIVE   Ketones, ur NEGATIVE  NEGATIVE mg/dL   Protein, ur 30 (*) NEGATIVE mg/dL   Urobilinogen, UA 0.2  0.0 - 1.0 mg/dL   Nitrite NEGATIVE  NEGATIVE   Leukocytes, UA TRACE (*) NEGATIVE  URINE MICROSCOPIC-ADD ON     Status: Abnormal   Collection Time    10/30/13  7:43 AM      Result Value Ref Range   Squamous Epithelial / LPF FEW (*) RARE   WBC, UA 3-6  <3 WBC/hpf   RBC / HPF TOO NUMEROUS TO COUNT  <3 RBC/hpf   Urine-Other MUCOUS PRESENT    POCT PREGNANCY, URINE     Status: Abnormal   Collection Time    10/30/13  7:55 AM      Result Value Ref Range   Preg Test, Ur POSITIVE (*) NEGATIVE   Comment:            THE SENSITIVITY OF THIS     METHODOLOGY IS >24 mIU/mL  CBC     Status: None   Collection Time    10/30/13  8:23 AM      Result Value Ref Range   WBC  8.3  4.0 - 10.5 K/uL   RBC 4.63  3.87 - 5.11 MIL/uL   Hemoglobin 13.7  12.0 - 15.0 g/dL   HCT 16.1  09.6 - 04.5 %   MCV 87.7  78.0 - 100.0 fL   MCH 29.6  26.0 - 34.0 pg   MCHC 33.7  30.0 - 36.0 g/dL   RDW 40.9  81.1 - 91.4 %   Platelets 391  150 - 400 K/uL  ABO/RH     Status: None   Collection Time    10/30/13  8:23 AM      Result Value Ref Range   ABO/RH(D) A POS    HCG, QUANTITATIVE, PREGNANCY     Status: Abnormal   Collection Time    10/30/13  8:23 AM      Result Value Ref Range   hCG, Beta Chain, Quant, S 1835 (*) <5 mIU/mL   Comment:              GEST. AGE      CONC.  (mIU/mL)       <=1 WEEK        5 - 50         2 WEEKS       50 - 500         3 WEEKS       100 - 10,000         4 WEEKS     1,000 - 30,000         5 WEEKS     3,500 - 115,000       6-8 WEEKS     12,000 - 270,000        12 WEEKS     15,000 - 220,000                FEMALE AND NON-PREGNANT FEMALE:         LESS THAN 5 mIU/mL  WET PREP, GENITAL     Status: Abnormal   Collection Time    10/30/13  8:39 AM      Result Value Ref Range   Yeast Wet Prep HPF POC FEW (*) NONE SEEN   Trich, Wet  Prep NONE SEEN  NONE SEEN   Clue Cells Wet Prep HPF POC NONE SEEN  NONE SEEN   WBC, Wet Prep HPF POC FEW (*) NONE SEEN   Comment: FEW BACTERIA SEEN    US Ob Comp Less 14 Wks  10/30/2013   CLINICAL DATA:  Cramping and bleeding  EXAM: OBSTETRIC <14 WK [place] AND TRANSVAGINAL OB US  TECHNIQUE: Both transabdominal and transvaginal ultrasound examinations were performed for complete evaluation of the gestation as well as the maternal uterus, adnexal regions, and pelvic cul-de-sac. Transvaginal technique was performed to assess early pregnancy.  COMPARISON:  None.  FINDINGS: Intrauterine gestational sac: Single gestational sac is noted. During the examination intrauterine gestational sac is mildly a regular and show some motion towards the inferior aspect of the endometrial canal. No definitive fetal pole is noted.  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  IMPRESSION: Probable early intrauterine gestational sac, but no yolk sac, fetal pole, or cardiac activity yet visualized. Recommend follow-up quantitative B-HCG levels and follow-up US in 14 days to confirm and assess viability.  This recommendation follows SRU consensus guidelines: Diagnostic Criteria for Nonviable Pregnancy Early in the First Trimester. Malva Limes Med 2013; 191:4782-95.  It should be noted that the gestational sac is somewhat irregular and appears somewhat mobile during the real-time examination. Clinical correlation is recommended.   Electronically Signed   By: Alcide Clever M.D.   On: 10/30/2013 10:03   US Ob Transvaginal  10/30/2013   CLINICAL DATA:  Cramping and bleeding  EXAM: OBSTETRIC <14 WK [place] AND TRANSVAGINAL OB US  TECHNIQUE: Both transabdominal and transvaginal ultrasound examinations were performed for complete evaluation of the gestation as well as the maternal uterus, adnexal  regions, and pelvic cul-de-sac. Transvaginal technique was performed to assess early pregnancy.  COMPARISON:  None.  FINDINGS: Intrauterine gestational sac: Single gestational sac is noted. During the examination intrauterine gestational sac is mildly a regular and show some motion towards the inferior aspect of the endometrial canal. No definitive fetal pole is noted.  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  IMPRESSION: Probable early intrauterine gestational sac, but no yolk sac, fetal pole, or cardiac activity yet visualized. Recommend follow-up quantitative B-HCG levels and follow-up US in 14 days to confirm and assess viability. This recommendation follows SRU consensus guidelines: Diagnostic Criteria for Nonviable Pregnancy Early in the First Trimester. Malva Limes Med 2013; 621:3086-57.  It should be noted that the gestational sac is somewhat irregular and appears somewhat mobile during the real-time examination. Clinical correlation is recommended.   Electronically Signed   By: Alcide Clever M.D.   On: 10/30/2013 10:03     Review of Systems  Constitutional: Negative for fever and chills.  Gastrointestinal: Positive for abdominal pain (Bilateral lower abdominal pain. Worse in the suprapubic area. ). Negative for nausea and vomiting.  Genitourinary: Positive for frequency. Negative for dysuria, urgency, hematuria and flank pain.       No vaginal discharge. + vaginal bleeding. No dysuria.    Physical Exam   Blood pressure 122/73, pulse 104, temperature 98.2 F (36.8 C), temperature source Oral, resp. rate 18, height  (1.575 m), weight 59.024 kg (130 lb 2 oz), last menstrual period 09/15/2013.  Physical Exam  Constitutional: She is oriented to person, place, and time. She appears well-developed and well-nourished. No distress.   HENT:  Head: Normocephalic.  Eyes: Pupils are equal, round, and reactive to light.  Neck: Neck supple.  Respiratory: Effort normal.  GI: Soft. There is tenderness in the suprapubic area. There is no rigidity, no rebound and no guarding.  Genitourinary:  Speculum exam: Vagina - Small amount of dark red blood pooling in the vaginal canal. One fox swab used Cervix - + active bleeding from os Bimanual exam: Cervix closed, no CMT  Uterus non tender, normal size Adnexa non tender, no masses bilaterally, +suprapubic tenderness  GC/Chlam, wet prep done Chaperone present for exam.   Musculoskeletal: Normal range of motion.  Neurological: She is alert and oriented to person, place, and time.  Skin: Skin is warm. She is not diaphoretic.  Psychiatric: Her behavior is normal.    MAU Course  Procedures None  MDM UA UPT Urine culture GC Wet prep ABO CBC [place] for viability  Discussed plan of care with Dr. Sallye Ober  A positive blood type  Will  treat for yeast vaginitis   Assessment and Plan   A:  1. Yeast vaginitis   2. Vaginal bleeding in pregnancy, first trimester ; threatened miscarriage     P:  Discharge home in stable condition RX: Miconazol 7 day treatment for yeast vaginitis  Return to MAU as needed, if symptoms worsen Return to MAU in 48 hours for repeat beta hcg Bleeding precautions Support given    Iona Hansen Rasch, NP  10/30/2013, 8:23 AM

## 2013-10-30 NOTE — MAU Note (Signed)
Lower abdominal cramping since 3am, bright red bleeding 4 or 5 times since 0500.  Told to come in by provider.

## 2013-10-31 LAB — URINE CULTURE: Colony Count: 2000

## 2013-11-01 ENCOUNTER — Inpatient Hospital Stay (HOSPITAL_COMMUNITY)
Admission: AD | Admit: 2013-11-01 | Discharge: 2013-11-01 | Disposition: A | Discharge status: Home / Self Care | Payer: PRIVATE HEALTH INSURANCE | Source: Ambulatory Visit | Attending: Obstetrics and Gynecology | Admitting: Obstetrics and Gynecology

## 2013-11-01 DIAGNOSIS — O209 Hemorrhage in early pregnancy, unspecified: Secondary | ICD-10-CM | POA: Diagnosis present

## 2013-11-01 DIAGNOSIS — O039 Complete or unspecified spontaneous abortion without complication: Secondary | ICD-10-CM | POA: Diagnosis not present

## 2013-11-01 LAB — HCG, QUANTITATIVE, PREGNANCY: hCG, Beta Chain, Quant, S: 246 m[IU]/mL — ABNORMAL HIGH (ref <5)

## 2013-11-01 NOTE — MAU Note (Signed)
HHogan, CNM notified of lab results.

## 2013-11-01 NOTE — MAU Provider Note (Signed)
  History     CSN: [id number]  Arrival date and time: 11/01/13 4098   None     Chief Complaint  Patient presents with  . Follow-up   HPI  Diane Barrett is a 33 y.o. a G1P0 at [redacted]w[redacted]d who presents today for FU HCG.  She states taht she had heavy bleeding on Saturday, but that yesterday it was much light. Today she is having spotting. She denies any pain.   Past Medical History  Diagnosis Date  . Medical history non-contributory     Past Surgical History  Procedure Laterality Date  . Vaginal cyst drained  04/2007, 01/2009  . No past surgeries      Family History  Problem Relation Age of Onset  . Thyroid disease Mother   . Hypertension Father   . Coronary artery disease Maternal Grandfather   . Coronary artery disease Paternal Grandfather     History  Substance Use Topics  . Smoking status: Never Smoker   . Smokeless tobacco: Not on file  . Alcohol Use: No    Allergies: No Known Allergies  Prescriptions prior to admission  Medication Sig Dispense Refill  . miconazole (GNP MICONAZOLE 7) 2 % vaginal cream Place 1 Applicatorful vaginally at bedtime.  45 g  0  . Prenatal Vit-Fe Fumarate-FA (PRENATAL MULTIVITAMIN) TABS tablet Take 1 tablet by mouth daily at 12 noon.        ROS Physical Exam   Blood pressure 123/73, pulse 106, temperature 98.4 F (36.9 C), temperature source Oral, resp. rate 16, last menstrual period 09/15/2013.  Physical Exam  Nursing note and vitals reviewed. Constitutional: She is oriented to person, place, and time. She appears well-developed and well-nourished. No distress.  Cardiovascular: Normal rate.   Respiratory: Effort normal.  GI: Soft. There is no tenderness. There is no rebound.  Neurological: She is alert and oriented to person, place, and time.  Skin: Skin is warm and dry.  Psychiatric: She has a normal mood and affect.    MAU Course  Procedures Results for Diane, Barrett (MRN [id number]) as of 11/01/2013 07:58  Ref. Range  10/30/2013 08:23 10/30/2013 08:39 10/30/2013 09:52 11/01/2013 06:50  hCG, Beta Chain, Quant, S Latest Range: <5 mIU/mL 1835 (H)   246 (H)  0755: D/W Dr. Dion Body, ok for dc home. FU with the office in 2 weeks.  Assessment and Plan   1. SAB (spontaneous abortion)    Bleeding precautions  Return to MAU as needed Support given  Follow-up Information   Follow up with [facility] In 2 weeks.   Specialty:  Obstetrics and Gynecology   Contact information:   [address] [place] [address] [phone]        Tawnya Crook 11/01/2013, 8:00 AM

## 2013-11-01 NOTE — MAU Provider Note (Signed)
Attestation of Attending Supervision of Advanced Practitioner (CNM/NP): Evaluation and management procedures were performed by the Advanced Practitioner under my supervision and collaboration.  I have reviewed the Advanced Practitioner's note and chart, and I agree with the management and plan.  Donzella Carrol 11/01/2013 8:49 AM

## 2013-11-01 NOTE — MAU Note (Signed)
Pt presents for follow up BHCG. Denies pain, has some pink on tissue when she wipes.

## 2013-11-01 NOTE — Discharge Instructions (Signed)
Miscarriage A miscarriage is the sudden loss of an unborn baby (fetus) before the 20th week of pregnancy. Most miscarriages happen in the first 3 months of pregnancy. Sometimes, it happens before a woman even knows she is pregnant. A miscarriage is also called a "spontaneous miscarriage" or "early pregnancy loss." Having a miscarriage can be an emotional experience. Talk with your caregiver about any questions you may have about miscarrying, the grieving process, and your future pregnancy plans. CAUSES   Problems with the fetal chromosomes that make it impossible for the baby to develop normally. Problems with the baby's genes or chromosomes are most often the result of errors that occur, by chance, as the embryo divides and grows. The problems are not inherited from the parents.  Infection of the cervix or uterus.   Hormone problems.   Problems with the cervix, such as having an incompetent cervix. This is when the tissue in the cervix is not strong enough to hold the pregnancy.   Problems with the uterus, such as an abnormally shaped uterus, uterine fibroids, or congenital abnormalities.   Certain medical conditions.   Smoking, drinking alcohol, or taking illegal drugs.   Trauma.  Often, the cause of a miscarriage is unknown.  SYMPTOMS   Vaginal bleeding or spotting, with or without cramps or pain.  Pain or cramping in the abdomen or lower back.  Passing fluid, tissue, or blood clots from the vagina. DIAGNOSIS  Your caregiver will perform a physical exam. You may also have an ultrasound to confirm the miscarriage. Blood or urine tests may also be ordered. TREATMENT   Sometimes, treatment is not necessary if you naturally pass all the fetal tissue that was in the uterus. If some of the fetus or placenta remains in the body (incomplete miscarriage), tissue left behind may become infected and must be removed. Usually, a dilation and curettage (D and C) procedure is performed.  During a D and C procedure, the cervix is widened (dilated) and any remaining fetal or placental tissue is gently removed from the uterus.  Antibiotic medicines are prescribed if there is an infection. Other medicines may be given to reduce the size of the uterus (contract) if there is a lot of bleeding.  If you have Rh negative blood and your baby was Rh positive, you will need a Rh immunoglobulin shot. This shot will protect any future baby from having Rh blood problems in future pregnancies. HOME CARE INSTRUCTIONS   Your caregiver may order bed rest or may allow you to continue light activity. Resume activity as directed by your caregiver.  Have someone help with home and family responsibilities during this time.   Keep track of the number of sanitary pads you use each day and how soaked (saturated) they are. Write down this information.   Do not use tampons. Do not douche or have sexual intercourse until approved by your caregiver.   Only take over-the-counter or prescription medicines for pain or discomfort as directed by your caregiver.   Do not take aspirin. Aspirin can cause bleeding.   Keep all follow-up appointments with your caregiver.   If you or your partner have problems with grieving, talk to your caregiver or seek counseling to help cope with the pregnancy loss. Allow enough time to grieve before trying to get pregnant again.  SEEK IMMEDIATE MEDICAL CARE IF:   You have severe cramps or pain in your back or abdomen.  You have a fever.  You pass large blood clots (walnut-sized   or larger) ortissue from your vagina. Save any tissue for your caregiver to inspect.   Your bleeding increases.   You have a thick, bad-smelling vaginal discharge.  You become lightheaded, weak, or you faint.   You have chills.  MAKE SURE YOU:  Understand these instructions.  Will watch your condition.  Will get help right away if you are not doing well or get  worse. Document Released: 08/14/2000 Document Revised: 06/15/2012 Document Reviewed: 04/09/2011 [employer] Patient Information 2015 [employer]. This information is not intended to replace advice given to you by your health care provider. Make sure you discuss any questions you have with your health care provider.  

## 2013-11-02 LAB — GC/CHLAMYDIA PROBE AMP
CT PROBE, AMP APTIMA: NEGATIVE
GC Probe RNA: NEGATIVE

## 2013-12-17 ENCOUNTER — Other Ambulatory Visit: Payer: Self-pay

## 2014-01-03 ENCOUNTER — Encounter (HOSPITAL_COMMUNITY): Payer: Self-pay

## 2014-09-04 ENCOUNTER — Encounter (HOSPITAL_COMMUNITY): Payer: Self-pay | Admitting: *Deleted

## 2014-11-04 ENCOUNTER — Other Ambulatory Visit: Payer: Self-pay | Admitting: Obstetrics and Gynecology

## 2014-11-04 ENCOUNTER — Other Ambulatory Visit (HOSPITAL_COMMUNITY)
Admission: RE | Admit: 2014-11-04 | Discharge: 2014-11-04 | Disposition: A | Discharge status: Home / Self Care | Payer: PRIVATE HEALTH INSURANCE | Source: Ambulatory Visit | Attending: Obstetrics and Gynecology | Admitting: Obstetrics and Gynecology

## 2014-11-04 DIAGNOSIS — Z01419 Encounter for gynecological examination (general) (routine) without abnormal findings: Secondary | ICD-10-CM | POA: Diagnosis not present

## 2014-11-09 LAB — CYTOLOGY - PAP

## 2015-03-08 ENCOUNTER — Telehealth: Payer: Self-pay | Admitting: Family Medicine

## 2015-03-10 ENCOUNTER — Encounter: Payer: PRIVATE HEALTH INSURANCE | Admitting: Family Medicine

## 2015-03-24 NOTE — Telephone Encounter (Signed)
Error

## 2015-07-28 ENCOUNTER — Encounter: Payer: Self-pay | Admitting: Family Medicine

## 2016-05-01 IMAGING — US US OB TRANSVAGINAL
1 series · 13 of 28 positions shown · non-contrast
Comparison: None.

CLINICAL DATA: Cramping and bleeding

EXAM:
OBSTETRIC <14 WK US AND TRANSVAGINAL OB US
TECHNIQUE: Both transabdominal and transvaginal ultrasound examinations were
performed for complete evaluation of the gestation as well as the
maternal uterus, adnexal regions, and pelvic cul-de-sac.
Transvaginal technique was performed to assess early pregnancy.

[Series 1: us ob comp less 14 wks · 13 of 52 slices shown]
[im 2/52]
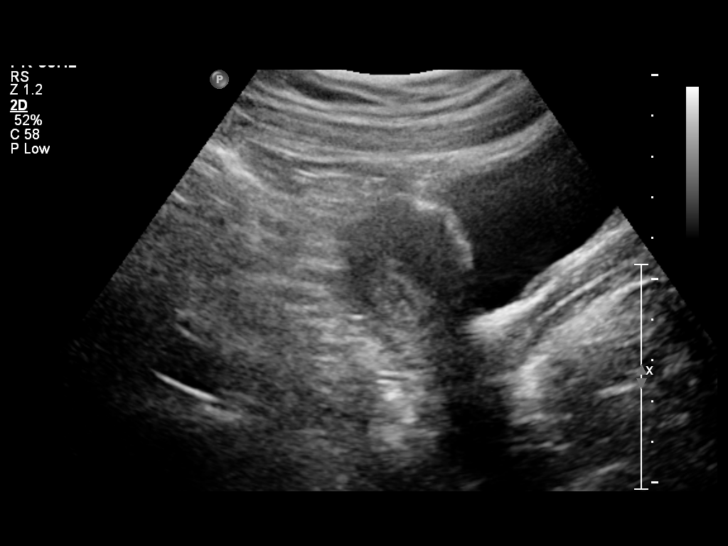
[im 6/52]
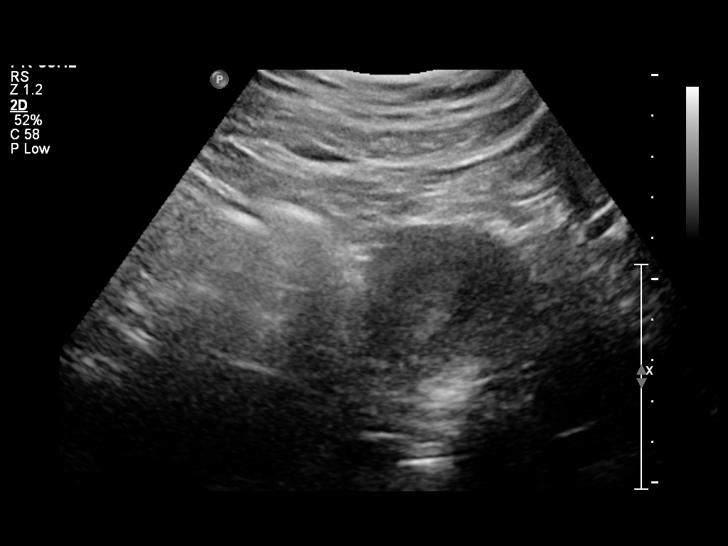
[im 10/52]
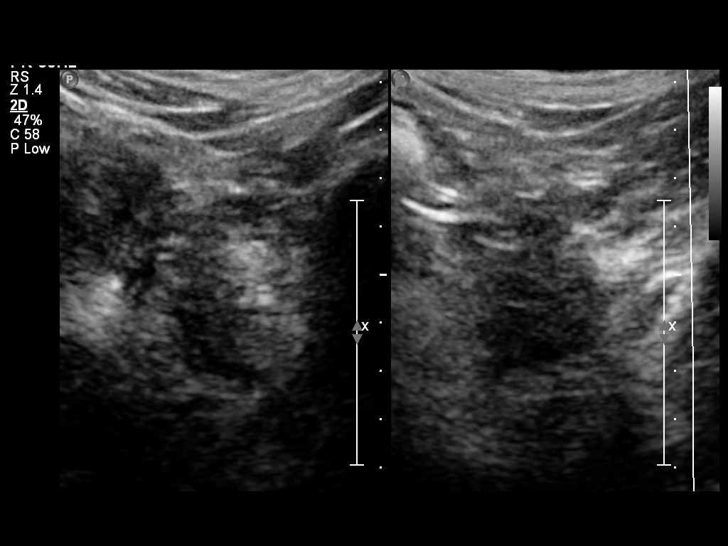
[im 14/52]
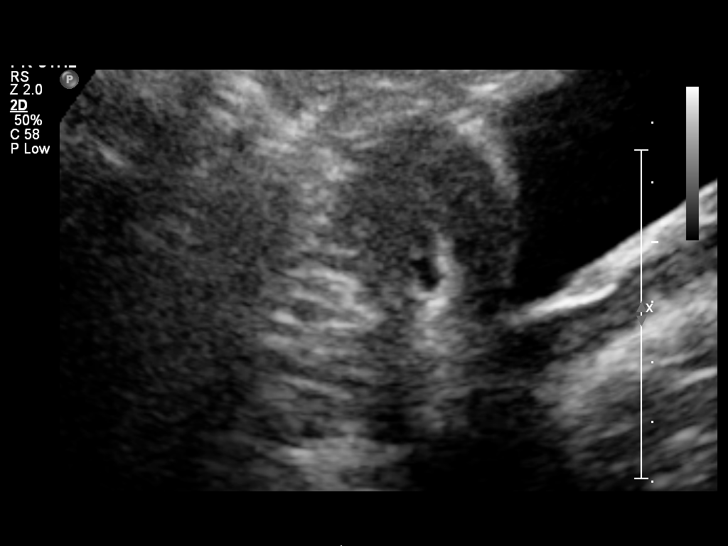
[im 18/52]
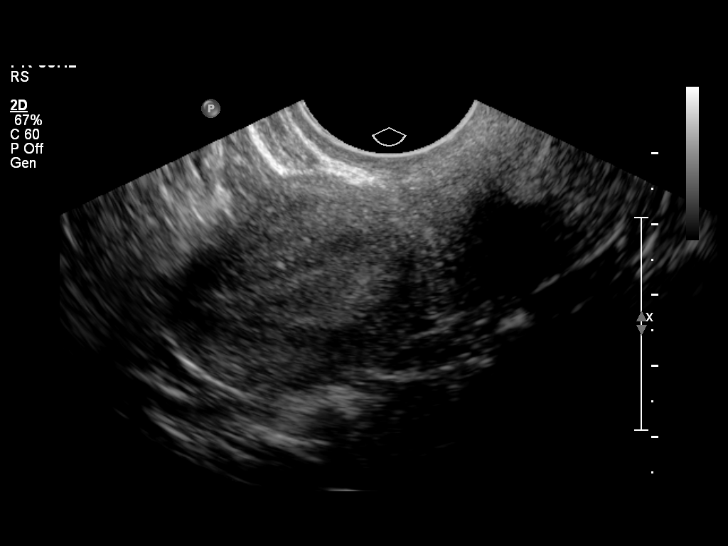
[im 21/52]
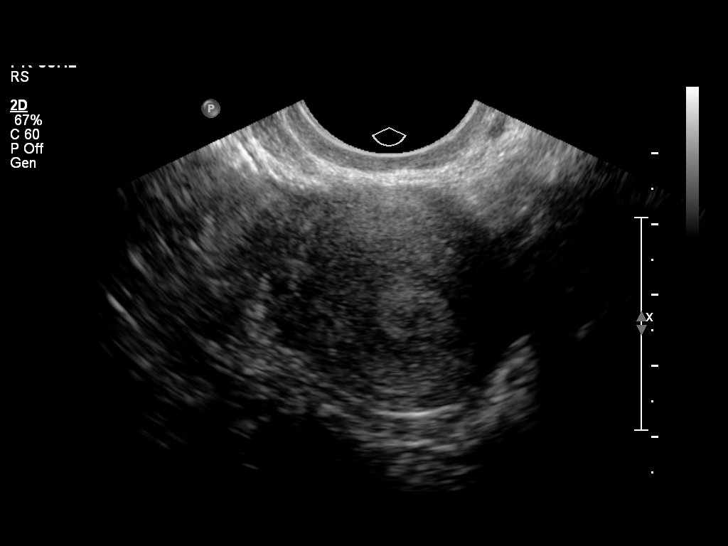
[im 27/52]
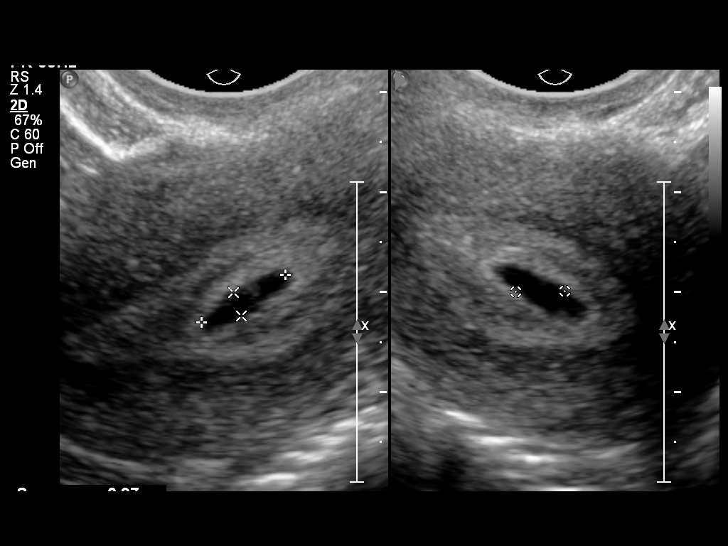
[im 31/52]
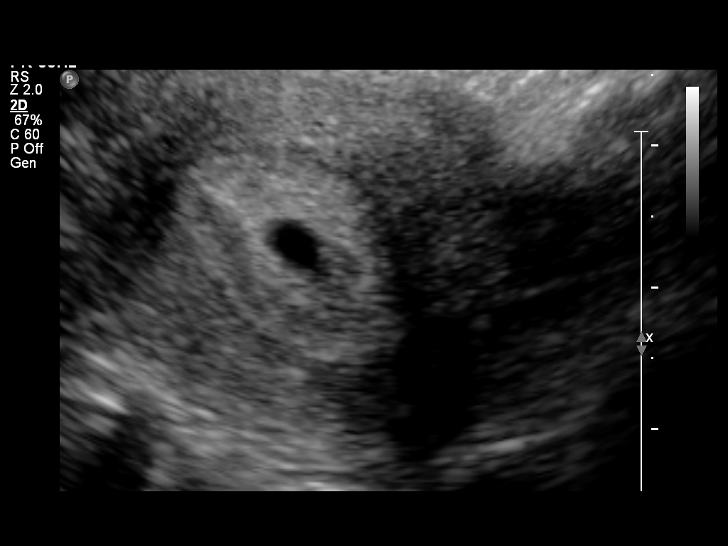
[im 35/52]
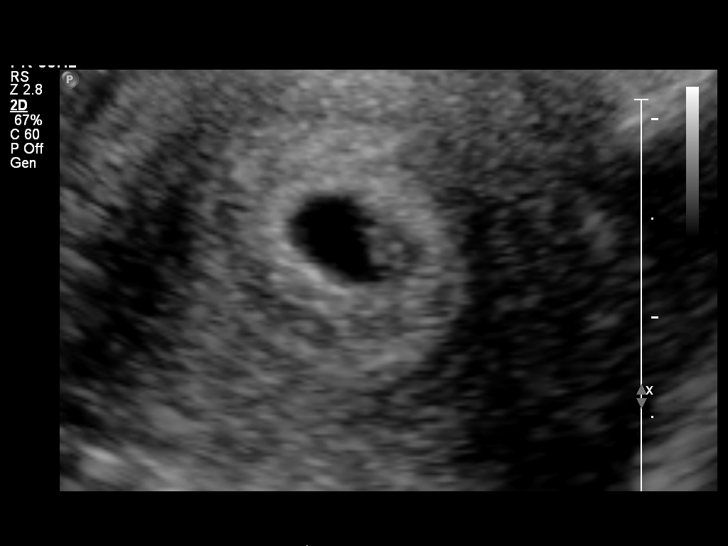
[im 38/52]
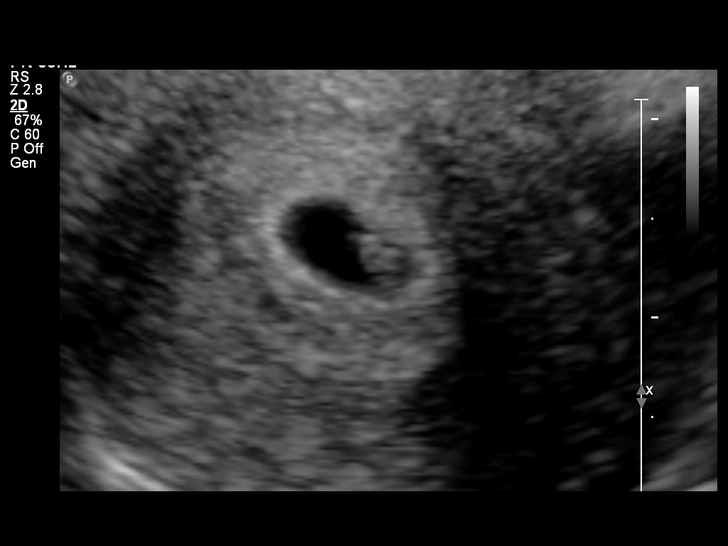
[im 42/52]
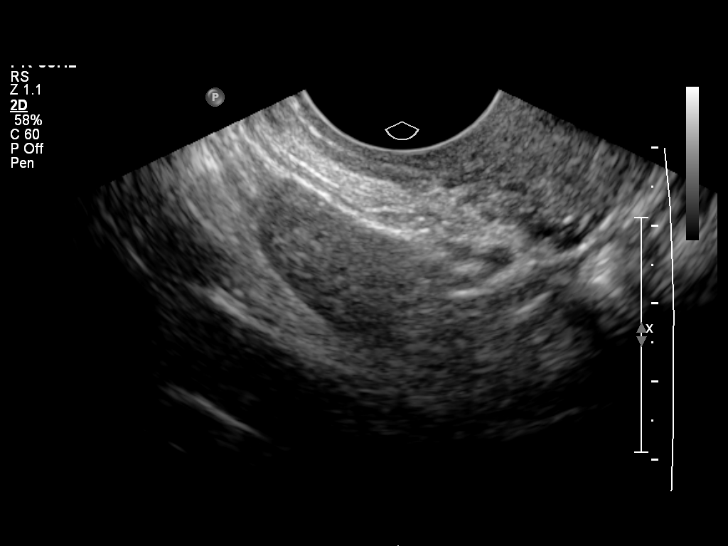
[im 46/52]
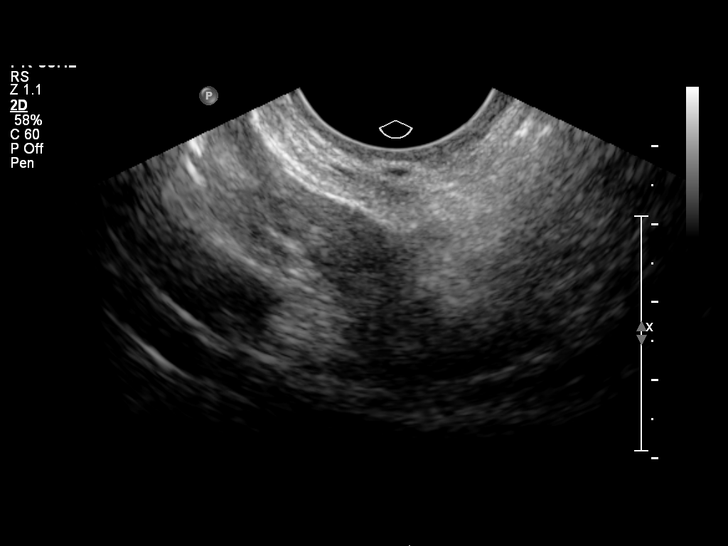
[im 50/52]
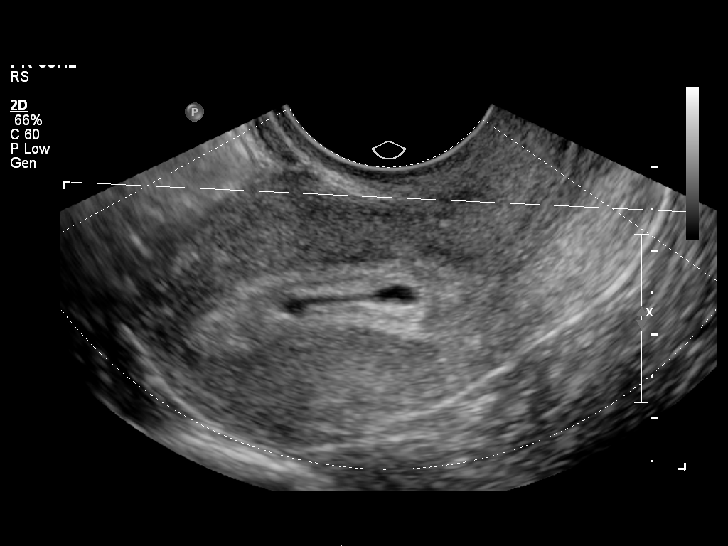

[13 of 28 positions shown; findings below may reference images not displayed]

FINDINGS: Intrauterine gestational sac: Single gestational sac is noted.
During the examination intrauterine gestational sac is mildly a
regular and show some motion towards the inferior aspect of the
endometrial canal. No definitive fetal pole is noted.

Yolk sac:  Not well visualized

Embryo:  Not well visualized

MSD:  5.7  mm   5 w   1  d

US EDC: 07/01/2014

Maternal uterus/adnexae: There are changes suggestive of the
unicornuate uterus on the left.
IMPRESSION: Probable early intrauterine gestational sac, but no yolk sac, fetal
pole, or cardiac activity yet visualized. Recommend follow-up
quantitative B-HCG levels and follow-up US in 14 days to confirm and
assess viability. This recommendation follows SRU consensus
guidelines: Diagnostic Criteria for Nonviable Pregnancy Early in the
First Trimester. N Engl J Med 5067; [DATE].

It should be noted that the gestational sac is somewhat irregular
and appears somewhat mobile during the real-time examination.
Clinical correlation is recommended.
